# Patient Record
Sex: Male | Born: 1980 | Race: White | Hispanic: No | Marital: Single | Smoking: Former smoker
Health system: Southern US, Community
[De-identification: ages and names within clinical notes are randomized; demographics above are authoritative.]

## PROBLEM LIST (undated history)

## (undated) HISTORY — PX: WISDOM TOOTH EXTRACTION: SHX21

---

## 2002-03-17 HISTORY — PX: HIP SURGERY: SHX245

## 2002-03-29 ENCOUNTER — Encounter: Admission: RE | Admit: 2002-03-29 | Discharge: 2002-03-29 | Payer: Self-pay | Admitting: Orthopedic Surgery

## 2002-03-29 ENCOUNTER — Encounter: Payer: Self-pay | Admitting: Orthopedic Surgery

## 2004-07-23 ENCOUNTER — Ambulatory Visit: Payer: Self-pay | Admitting: Internal Medicine

## 2004-07-26 ENCOUNTER — Ambulatory Visit: Payer: Self-pay | Admitting: Internal Medicine

## 2004-11-01 ENCOUNTER — Ambulatory Visit: Payer: Self-pay | Admitting: Internal Medicine

## 2010-08-27 ENCOUNTER — Emergency Department: Payer: Self-pay | Admitting: Emergency Medicine

## 2010-08-28 ENCOUNTER — Ambulatory Visit: Payer: Self-pay | Admitting: Family Medicine

## 2014-03-14 ENCOUNTER — Emergency Department: Payer: Self-pay | Admitting: Emergency Medicine

## 2014-11-05 ENCOUNTER — Ambulatory Visit
Admission: EM | Admit: 2014-11-05 | Discharge: 2014-11-05 | Disposition: A | Discharge status: Home / Self Care | Payer: BLUE CROSS/BLUE SHIELD | Attending: Family Medicine | Admitting: Family Medicine

## 2014-11-05 DIAGNOSIS — L237 Allergic contact dermatitis due to plants, except food: Secondary | ICD-10-CM | POA: Diagnosis not present

## 2014-11-05 MED ORDER — METHYLPREDNISOLONE 4 MG PO TBPK: ORAL_TABLET | ORAL | Status: DC

## 2014-11-05 MED ORDER — TRIAMCINOLONE ACETONIDE 0.1 % EX CREA: 1.0000 "application " | TOPICAL_CREAM | Freq: Two times a day (BID) | CUTANEOUS | Status: DC

## 2014-11-05 MED ORDER — MUPIROCIN 2 % EX OINT: 1.0000 "application " | TOPICAL_OINTMENT | Freq: Three times a day (TID) | CUTANEOUS | Status: DC

## 2014-11-05 NOTE — Discharge Instructions (Signed)

## 2014-11-05 NOTE — ED Notes (Signed)
Patient states that 2 days ago he got into some poision oak or Ivy. He states that rash originally started on hands and has now spread up arm and to other places on body. Patient describes rash as itchy.

## 2014-11-05 NOTE — ED Provider Notes (Signed)
CSN: [id number]     Arrival date & time 11/05/14  1113 History   First MD Initiated Contact with Patient 11/05/14 1207     Chief Complaint  Patient presents with  . Rash   (Consider location/radiation/quality/duration/timing/severity/associated sxs/prior Treatment) HPI   This 34 year old male resents with rash after he was involved with cutting trees  At work and also in his own yard. Is very similar to previous episodes of poison ivy that he's had in the past. Is very itchy. It involves his arms and a little on his legs mostly. He also mentioned getting some sores on the inside of his nose is bothering him on and off. Does not drain and is usually over the just the opening of the nares and switches from side to side.  No past medical history on file. Past Surgical History  Procedure Laterality Date  . Hip surgery  2004  . Wisdom tooth extraction     No family history on file. Social History  Substance Use Topics  . Smoking status: Never Smoker   . Smokeless tobacco: None  . Alcohol Use: No    Review of Systems  Constitutional: Negative for fever, chills and fatigue.  Skin: Positive for rash.  All other systems reviewed and are negative.   Allergies  Review of patient's allergies indicates no known allergies.  Home Medications   Prior to Admission medications   Medication Sig Start Date End Date Taking? Authorizing Provider  methylPREDNISolone (MEDROL DOSEPAK) 4 MG TBPK tablet Take by mouth PC lunch. 11/05/14   Lutricia Feil, PA-C  mupirocin ointment (BACTROBAN) 2 % Apply 1 application topically 3 (three) times daily. 11/05/14   Lutricia Feil, PA-C  triamcinolone cream (KENALOG) 0.1 % Apply 1 application topically 2 (two) times daily. 11/05/14   Chrissie Noa Burnis Kaser, PA-C   BP 106/63 mmHg  Pulse 52  Temp(Src) 97.5 F (36.4 C) (Tympanic)  Resp 18  Ht 5\' 11"  (1.803 m)  Wt 155 lb (70.308 kg)  BMI 21.63 kg/m2  SpO2 100% Physical Exam  Constitutional: He is oriented to  person, place, and time. He appears well-developed and well-nourished.  HENT:  Head: Normocephalic and atraumatic.  Mouth/Throat: Oropharynx is clear and moist.  Eyes: Pupils are equal, round, and reactive to light.  Neck: Neck supple.  Musculoskeletal: Normal range of motion.  Neurological: He is alert and oriented to person, place, and time.  Skin: Skin is warm and dry. Rash noted.  His upper extremities show patches of linear and circular vesicles on erythematous base with excoriations. He has one small area on his left lateral hip.  Psychiatric: He has a normal mood and affect. His behavior is normal. Judgment and thought content normal.  Nursing note and vitals reviewed.   ED Course  Procedures (including critical care time) Labs Review Labs Reviewed - No data to display  Imaging Review No results found.   MDM   1. Poison ivy dermatitis    New Prescriptions   METHYLPREDNISOLONE (MEDROL DOSEPAK) 4 MG TBPK TABLET    Take by mouth PC lunch.   MUPIROCIN OINTMENT (BACTROBAN) 2 %    Apply 1 application topically 3 (three) times daily.   TRIAMCINOLONE CREAM (KENALOG) 0.1 %    Apply 1 application topically 2 (two) times daily.  Plan: 1. Diagnosis reviewed with patient 2. rx as per orders; risks, benefits, potential side effects reviewed with patient 3. Recommend supportive treatment with cool compresses PRN.  4. F/u prn if symptoms worsen  or don't improve     Lutricia Feil, PA-C 11/05/14 1241

## 2015-01-26 ENCOUNTER — Ambulatory Visit: Payer: Worker's Compensation

## 2015-01-26 ENCOUNTER — Ambulatory Visit
Admission: EM | Admit: 2015-01-26 | Discharge: 2015-01-26 | Disposition: A | Discharge status: Home / Self Care | Payer: Worker's Compensation | Attending: Family Medicine | Admitting: Family Medicine

## 2015-01-26 ENCOUNTER — Encounter: Payer: Self-pay | Admitting: *Deleted

## 2015-01-26 ENCOUNTER — Ambulatory Visit (INDEPENDENT_AMBULATORY_CARE_PROVIDER_SITE_OTHER): Payer: Worker's Compensation

## 2015-01-26 DIAGNOSIS — S93402A Sprain of unspecified ligament of left ankle, initial encounter: Secondary | ICD-10-CM | POA: Diagnosis not present

## 2015-01-26 MED ORDER — ACETAMINOPHEN 500 MG PO TABS: 500.0000 mg | ORAL_TABLET | Freq: Four times a day (QID) | ORAL | Status: DC | PRN

## 2015-01-26 MED ORDER — IBUPROFEN 800 MG PO TABS: 800.0000 mg | ORAL_TABLET | Freq: Three times a day (TID) | ORAL | Status: DC

## 2015-01-26 MED ORDER — TRAMADOL HCL 50 MG PO TABS: 50.0000 mg | ORAL_TABLET | Freq: Four times a day (QID) | ORAL | Status: DC | PRN

## 2015-01-26 NOTE — ED Notes (Signed)
Patient hurt his left ankle today during work at 12:30pm. 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 He turned his left ankle during the accident.

## 2015-01-26 NOTE — ED Provider Notes (Signed)
CSN: [id number]     Arrival date & time 01/26/15  1908 History   First MD Initiated Contact with Patient 01/26/15 1946     Chief Complaint  Patient presents with  . Leg Pain    left side   (Consider location/radiation/quality/duration/timing/severity/associated sxs/prior Treatment) HPI Comments: Married caucasian male works for [employer] here for left foot lower leg pain after slipping off mower and turning left ankle at 1230 today kept working until he had to leave work at 1700 worsening pain with bearing weight ankle and distal to knee pain even with nonweight bearing.  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  Patient is a 34 y.o. male presenting with leg pain. The history is provided by the patient and the spouse.  Leg Pain Location:  Leg and foot Time since incident:  8 hours Injury: yes   Mechanism of injury: fall   Fall:    Height of fall:  2 feet   Impact surface:  Grass   Point of impact:  Feet   Entrapped after fall: no   Leg location:  L leg Foot location:  L foot Pain details:    Quality:  Aching and throbbing   Radiates to:  Does not radiate   Severity:  Severe   Onset quality:  Sudden   Duration:  8 hours   Timing:  Constant   Progression:  Worsening Chronicity:  New Dislocation: no   Foreign body present:  No foreign bodies Prior injury to area:  No Relieved by:  Nothing Worsened by:  Bearing weight, flexion, rotation, adduction, abduction, extension and activity Ineffective treatments:  Rest, NSAIDs and elevation (vicodin) Associated symptoms: decreased ROM and swelling   Associated symptoms: no back pain, no fatigue, no fever, no itching, no muscle weakness, no neck pain, no numbness, no stiffness and no tingling   Risk factors: known bone disorder   Risk factors: no concern for non-accidental trauma, no frequent fractures, no obesity and no recent  illness     History reviewed. No pertinent past medical history. Past Surgical History  Procedure Laterality Date  . Hip surgery  2004  . Wisdom tooth extraction     History reviewed. No pertinent family history. Social History  Substance Use Topics  . Smoking status: Former Games developer  . Smokeless tobacco: Former Neurosurgeon  . Alcohol Use: No    Review of Systems  Constitutional: Negative for fever, chills, diaphoresis, activity change, appetite change, fatigue and unexpected weight change.  HENT: Negative for congestion, dental problem, drooling, ear discharge, ear pain, facial swelling, hearing loss, mouth sores, nosebleeds, postnasal drip, rhinorrhea, sinus pressure, sneezing, sore throat, tinnitus, trouble swallowing and voice change.   Eyes: Negative for photophobia, pain, discharge, redness, itching and visual disturbance.  Respiratory: Negative for cough, choking, chest tightness, shortness of breath, wheezing and stridor.   Cardiovascular: Negative for chest pain, palpitations and leg swelling.  Gastrointestinal: Negative for nausea, vomiting, abdominal pain, diarrhea, constipation, blood in stool and abdominal distention.  Endocrine: Negative for cold intolerance and heat intolerance.  Genitourinary: Negative for dysuria.  Musculoskeletal: Positive for myalgias, joint swelling and gait problem. Negative for back pain, arthralgias, stiffness, neck pain and neck stiffness.  Skin: Negative for color change, itching, pallor, rash and wound.  Allergic/Immunologic: Negative for environmental allergies and food allergies.  Neurological: Negative for dizziness, tremors, seizures, syncope, facial asymmetry, speech difficulty, weakness, light-headedness,  numbness and headaches.  Hematological: Negative for adenopathy. Does not bruise/bleed easily.  Psychiatric/Behavioral: Negative for behavioral problems, confusion, sleep disturbance and agitation.    Allergies  Review of patient's allergies  indicates no known allergies.  Home Medications   Prior to Admission medications   Medication Sig Start Date End Date Taking? Authorizing Provider  mupirocin ointment (BACTROBAN) 2 % Apply 1 application topically 3 (three) times daily. 11/05/14  Yes Lutricia Feil, PA-C  triamcinolone cream (KENALOG) 0.1 % Apply 1 application topically 2 (two) times daily. 11/05/14  Yes Lutricia Feil, PA-C  acetaminophen (TYLENOL) 500 MG tablet Take 1 tablet (500 mg total) by mouth every 6 (six) hours as needed for mild pain or moderate pain. 01/26/15   Barbaraann Barthel, NP  ibuprofen (ADVIL,MOTRIN) 800 MG tablet Take 1 tablet (800 mg total) by mouth 3 (three) times daily. 01/26/15   Barbaraann Barthel, NP  methylPREDNISolone (MEDROL DOSEPAK) 4 MG TBPK tablet Take by mouth PC lunch. 11/05/14   Lutricia Feil, PA-C  traMADol (ULTRAM) 50 MG tablet Take 1 tablet (50 mg total) by mouth every 6 (six) hours as needed for moderate pain or severe pain. 01/26/15   Barbaraann Barthel, NP   Meds Ordered and Administered this Visit  Medications - No data to display  BP 122/70 mmHg  Pulse 58  Temp(Src) 98.1 F (36.7 C) (Oral)  Resp 18  Ht 5\' 11"  (1.803 m)  Wt 155 lb (70.308 kg)  BMI 21.63 kg/m2  SpO2 98% No data found.   Physical Exam  Constitutional: He is oriented to person, place, and time. Vital signs are normal. He appears well-developed and well-nourished. He is cooperative.  Non-toxic appearance. He does not have a sickly appearance. He does not appear ill. No distress.  HENT:  Head: Normocephalic and atraumatic.  Right Ear: Hearing and external ear normal.  Left Ear: Hearing and external ear normal.  Nose: Nose normal.  Mouth/Throat: Oropharynx is clear and moist. No oropharyngeal exudate.  Eyes: Conjunctivae, EOM and lids are normal. Pupils are equal, round, and reactive to light. Right eye exhibits no chemosis, no discharge, no exudate and no hordeolum. No foreign body present in the right eye.  Left eye exhibits no chemosis, no discharge, no exudate and no hordeolum. No foreign body present in the left eye. Right conjunctiva is not injected. Right conjunctiva has no hemorrhage. Left conjunctiva is not injected. Left conjunctiva has no hemorrhage. No scleral icterus. Right eye exhibits normal extraocular motion and no nystagmus. Left eye exhibits normal extraocular motion and no nystagmus. Right pupil is round and reactive. Left pupil is round and reactive. Pupils are equal.  Neck: Trachea normal and normal range of motion. Neck supple. No spinous process tenderness and no muscular tenderness present. No rigidity. No tracheal deviation, no edema, no erythema and normal range of motion present. No thyroid mass and no thyromegaly present.  Cardiovascular: Normal rate, regular rhythm, S1 normal, S2 normal, normal heart sounds, intact distal pulses and normal pulses.  PMI is not displaced.  Exam reveals no gallop and no friction rub.   No murmur heard. Pulses:      Dorsalis pedis pulses are 2+ on the right side, and 2+ on the left side.       Posterior tibial pulses are 2+ on the right side, and 2+ on the left side.  Pulmonary/Chest: Effort normal and breath sounds normal. No accessory muscle usage or stridor. No respiratory distress. He has no decreased  breath sounds. He has no wheezes. He has no rhonchi. He has no rales. He exhibits no tenderness.  Abdominal: Soft. He exhibits no distension.  Musculoskeletal: He exhibits edema and tenderness.       Right shoulder: Normal.       Left shoulder: Normal.       Right elbow: Normal.      Left elbow: Normal.       Right wrist: Normal.       Left wrist: Normal.       Right hip: Normal.       Left hip: Normal.       Right knee: Normal.       Left knee: Normal.       Right ankle: Normal.       Left ankle: He exhibits decreased range of motion and swelling. He exhibits no ecchymosis, no deformity, no laceration and normal pulse. Tenderness. Lateral  malleolus, medial malleolus, head of 5th metatarsal and proximal fibula tenderness found. Achilles tendon normal. Achilles tendon exhibits no pain and no defect.       Cervical back: Normal.       Right forearm: Normal.       Left forearm: Normal.       Right hand: Normal.       Left hand: Normal.       Right lower leg: Normal.       Legs:      Right foot: Normal.       Left foot: There is decreased range of motion, tenderness and bony tenderness. There is no swelling, normal capillary refill, no crepitus, no deformity and no laceration.       Feet:  0-1+/4 nonpitting edema bilateral malleolus; TTP all MTPs, malleolus and distal tib/fibula; pain with eversion/inversion, flexion, extension toes lying on gurney not weight bearing left floot  Lymphadenopathy:    He has no cervical adenopathy.  Neurological: He is alert and oriented to person, place, and time. He is not disoriented. He displays no atrophy and no tremor. No cranial nerve deficit or sensory deficit. He exhibits normal muscle tone. He displays no seizure activity. Coordination normal. GCS eye subscore is 4. GCS verbal subscore is 5. GCS motor subscore is 6.  Gait not observed due to pain 10/10 with non weight bearing also  Skin: Skin is warm, dry and intact. No abrasion, no bruising, no burn, no ecchymosis, no laceration, no lesion, no petechiae and no rash noted. He is not diaphoretic. No cyanosis or erythema. No pallor. Nails show no clubbing.  Psychiatric: He has a normal mood and affect. His speech is normal and behavior is normal. Judgment and thought content normal. Cognition and memory are normal.  Nursing note and vitals reviewed.   ED Course  Procedures (including critical care time)  Labs Review Labs Reviewed - No data to display  Imaging Review Dg Tibia/fibula Left  01/26/2015  CLINICAL DATA:  Larey Seat at work with distal anterior lower leg pain. EXAM: LEFT TIBIA AND FIBULA - 2 VIEW COMPARISON:  None. FINDINGS: There  is no evidence of fracture or other focal bone lesions. Soft tissues are unremarkable. IMPRESSION: Negative. Electronically Signed   By: Paulina Fusi M.D.   On: 01/26/2015 20:06    Reviewed [place] Controlled Substances Website 03/14/2014 03/14/2014 TRAMADOL HCL 50 MG TABLET [id number] 30 5 0 0 [address] [place] , [place] [id number] Berline Chough,  [place], [place] 1980/04/25 [address] [place], [place] [address] 04  30  2025 Discussed with patient ankle sprain no fracture, bone lesions or dislocation noted.  Copy of radiology report given to patient.  Crutches and ASO sized and distributed from clinic stock by RN Jacklynn Lewis and given to patient.  Discussed elevation, ice, compression, crutches no further weight bearing tonight.  Take motrin 800mg  po upon arrival home.  No driving tonight due to vicodin administration by patient at home prior to arrival.  May take tramadol in am.  Tylenol 500mg  po tonight prn due to 600mg  prior to arrival if needed for breakthrough pain middle of night tonight.  MDM   1. Ankle sprain, left, initial encounter    Patient was instructed to rest, ice and elevate the ankle as much as possible.  Activity as tolerated and work on ROM exercises.  Patient is to take NSAIDS motrin 800mg  po TID and tylenol 1000mg  po QID  as needed. Tramadol 50mg  po q6h prn breakthrough pain not improved with nsaids, ice, elevation, ASO use. Discussed at risk to reinjure ankle over the next year and to wear supportive footwear/ankle sleeve/ace bandage.  Follow up re-evaluation in 7 days with [facility] provider.  Work[employer] worker paperwork completed and given to patient.  Avoid operating dangerous equipment while taking tramadol may cause sedation. exitcare handout on contusion, ankle sprain with rehab exercises, cryotherapy given to patient.  Work restriction note given to patient.  Patient  And spouse verbalized agreement and understanding of treatment plan and had no  further questions at this time.   P2:  Injury Prevention and Fitness.    Barbaraann Barthel, NP 01/26/15 2040

## 2015-01-26 NOTE — Discharge Instructions (Signed)
Acute Ankle Sprain With Phase I Rehab An acute ankle sprain is a partial or complete tear in one or more of the ligaments of the ankle due to traumatic injury. The severity of the injury depends on both the number of ligaments sprained and the grade of sprain. There are 3 grades of sprains.   A grade 1 sprain is a mild sprain. There is a slight pull without obvious tearing. There is no loss of strength, and the muscle and ligament are the correct length.  A grade 2 sprain is a moderate sprain. There is tearing of fibers within the substance of the ligament where it connects two bones or two cartilages. The length of the ligament is increased, and there is usually decreased strength.  A grade 3 sprain is a complete rupture of the ligament and is uncommon. In addition to the grade of sprain, there are three types of ankle sprains.  Lateral ankle sprains: This is a sprain of one or more of the three ligaments on the outer side (lateral) of the ankle. These are the most common sprains. Medial ankle sprains: There is one large triangular ligament of the inner side (medial) of the ankle that is susceptible to injury. Medial ankle sprains are less common. Syndesmosis, "high ankle," sprains: The syndesmosis is the ligament that connects the two bones of the lower leg. Syndesmosis sprains usually only occur with very severe ankle sprains. SYMPTOMS  Pain, tenderness, and swelling in the ankle, starting at the side of injury that may progress to the whole ankle and foot with time.  "Pop" or tearing sensation at the time of injury.  Bruising that may spread to the heel.  Impaired ability to walk soon after injury. CAUSES   Acute ankle sprains are caused by trauma placed on the ankle that temporarily forces or pries the anklebone (talus) out of its normal socket.  Stretching or tearing of the ligaments that normally hold the joint in place (usually due to a twisting injury). RISK INCREASES  WITH:  Previous ankle sprain.  Sports in which the foot may land awkwardly (i.e., basketball, volleyball, or soccer) or walking or running on uneven or rough surfaces.  Shoes with inadequate support to prevent sideways motion when stress occurs.  Poor strength and flexibility.  Poor balance skills.  Contact sports. PREVENTION   Warm up and stretch properly before activity.  Maintain physical fitness:  Ankle and leg flexibility, muscle strength, and endurance.  Cardiovascular fitness.  Balance training activities.  Use proper technique and have a coach correct improper technique.  Taping, protective strapping, bracing, or high-top tennis shoes may help prevent injury. Initially, tape is best; however, it loses most of its support function within 10 to 15 minutes.  Wear proper-fitted protective shoes (High-top shoes with taping or bracing is more effective than either alone).  Provide the ankle with support during sports and practice activities for 12 months following injury. PROGNOSIS   If treated properly, ankle sprains can be expected to recover completely; however, the length of recovery depends on the degree of injury.  A grade 1 sprain usually heals enough in 5 to 7 days to allow modified activity and requires an average of 6 weeks to heal completely.  A grade 2 sprain requires 6 to 10 weeks to heal completely.  A grade 3 sprain requires 12 to 16 weeks to heal.  A syndesmosis sprain often takes more than 3 months to heal. RELATED COMPLICATIONS   Frequent recurrence of symptoms may  result in a chronic problem. Appropriately addressing the problem the first time decreases the frequency of recurrence and optimizes healing time. Severity of the initial sprain does not predict the likelihood of later instability. °· Injury to other structures (bone, cartilage, or tendon). °· A chronically unstable or arthritic ankle joint is a possibility with repeated  sprains. °TREATMENT °Treatment initially involves the use of ice, medication, and compression bandages to help reduce pain and inflammation. Ankle sprains are usually immobilized in a walking cast or boot to allow for healing. Crutches may be recommended to reduce pressure on the injury. After immobilization, strengthening and stretching exercises may be necessary to regain strength and a full range of motion. Surgery is rarely needed to treat ankle sprains. °MEDICATION  °· Nonsteroidal anti-inflammatory medications, such as aspirin and ibuprofen (do not take for the first 3 days after injury or within 7 days before surgery), or other minor pain relievers, such as acetaminophen, are often recommended. Take these as directed by your caregiver. Contact your caregiver immediately if any bleeding, stomach upset, or signs of an allergic reaction occur from these medications. °· Ointments applied to the skin may be helpful. °· Pain relievers may be prescribed as necessary by your caregiver. Do not take prescription pain medication for longer than 4 to 7 days. Use only as directed and only as much as you need. °HEAT AND COLD °· Cold treatment (icing) is used to relieve pain and reduce inflammation for acute and chronic cases. Cold should be applied for 10 to 15 minutes every 2 to 3 hours for inflammation and pain and immediately after any activity that aggravates your symptoms. Use ice packs or an ice massage. °· Heat treatment may be used before performing stretching and strengthening activities prescribed by your caregiver. Use a heat pack or a warm soak. °SEEK IMMEDIATE MEDICAL CARE IF:  °· Pain, swelling, or bruising worsens despite treatment. °· You experience pain, numbness, discoloration, or coldness in the foot or toes. °· New, unexplained symptoms develop (drugs used in treatment may produce side effects.) °EXERCISES  °PHASE I EXERCISES °RANGE OF MOTION (ROM) AND STRETCHING EXERCISES - Ankle Sprain, Acute Phase I,  Weeks 1 to 2 °These exercises may help you when beginning to restore flexibility in your ankle. You will likely work on these exercises for the 1 to 2 weeks after your injury. Once your physician, physical therapist, or athletic trainer sees adequate progress, he or she will advance your exercises. While completing these exercises, remember:  °· Restoring tissue flexibility helps normal motion to return to the joints. This allows healthier, less painful movement and activity. °· An effective stretch should be held for at least 30 seconds. °· A stretch should never be painful. You should only feel a gentle lengthening or release in the stretched tissue. °RANGE OF MOTION - Dorsi/Plantar Flexion °· While sitting with your right / left knee straight, draw the top of your foot upwards by flexing your ankle. Then reverse the motion, pointing your toes downward. °· Hold each position for __________ seconds. °· After completing your first set of exercises, repeat this exercise with your knee bent. °Repeat __________ times. Complete this exercise __________ times per day.  °RANGE OF MOTION - Ankle Alphabet °· Imagine your right / left big toe is a pen. °· Keeping your hip and knee still, write out the entire alphabet with your "pen." Make the letters as large as you can without increasing any discomfort. °Repeat __________ times. Complete this exercise __________   times per day.  °STRENGTHENING EXERCISES - Ankle Sprain, Acute -Phase I, Weeks 1 to 2 °These exercises may help you when beginning to restore strength in your ankle. You will likely work on these exercises for 1 to 2 weeks after your injury. Once your physician, physical therapist, or athletic trainer sees adequate progress, he or she will advance your exercises. While completing these exercises, remember:  °· Muscles can gain both the endurance and the strength needed for everyday activities through controlled exercises. °· Complete these exercises as instructed by  your physician, physical therapist, or athletic trainer. Progress the resistance and repetitions only as guided. °· You may experience muscle soreness or fatigue, but the pain or discomfort you are trying to eliminate should never worsen during these exercises. If this pain does worsen, stop and make certain you are following the directions exactly. If the pain is still present after adjustments, discontinue the exercise until you can discuss the trouble with your clinician. °STRENGTH - Dorsiflexors °· Secure a rubber exercise band/tubing to a fixed object (i.e., table, pole) and loop the other end around your right / left foot. °· Sit on the floor facing the fixed object. The band/tubing should be slightly tense when your foot is relaxed. °· Slowly draw your foot back toward you using your ankle and toes. °· Hold this position for __________ seconds. Slowly release the tension in the band and return your foot to the starting position. °Repeat __________ times. Complete this exercise __________ times per day.  °STRENGTH - Plantar-flexors  °· Sit with your right / left leg extended. Holding onto both ends of a rubber exercise band/tubing, loop it around the ball of your foot. Keep a slight tension in the band. °· Slowly push your toes away from you, pointing them downward. °· Hold this position for __________ seconds. Return slowly, controlling the tension in the band/tubing. °Repeat __________ times. Complete this exercise __________ times per day.  °STRENGTH - Ankle Eversion °· Secure one end of a rubber exercise band/tubing to a fixed object (table, pole). Loop the other end around your foot just before your toes. °· Place your fists between your knees. This will focus your strengthening at your ankle. °· Drawing the band/tubing across your opposite foot, slowly, pull your little toe out and up. Make sure the band/tubing is positioned to resist the entire motion. °· Hold this position for __________ seconds. °Have  your muscles resist the band/tubing as it slowly pulls your foot back to the starting position.  °Repeat __________ times. Complete this exercise __________ times per day.  °STRENGTH - Ankle Inversion °· Secure one end of a rubber exercise band/tubing to a fixed object (table, pole). Loop the other end around your foot just before your toes. °· Place your fists between your knees. This will focus your strengthening at your ankle. °· Slowly, pull your big toe up and in, making sure the band/tubing is positioned to resist the entire motion. °· Hold this position for __________ seconds. °· Have your muscles resist the band/tubing as it slowly pulls your foot back to the starting position. °Repeat __________ times. Complete this exercises __________ times per day.  °STRENGTH - Towel Curls °· Sit in a chair positioned on a non-carpeted surface. °· Place your right / left foot on a towel, keeping your heel on the floor. °· Pull the towel toward your heel by only curling your toes. Keep your heel on the floor. °· If instructed by your physician, physical therapist,   or athletic trainer, add weight to the end of the towel. Repeat __________ times. Complete this exercise __________ times per day.   This information is not intended to replace advice given to you by your health care provider. Make sure you discuss any questions you have with your health care provider.   Document Released: 10/02/2004 Document Revised: 03/24/2014 Document Reviewed: 06/15/2008 Elsevier Interactive Patient Education 2016 [place].  Cryotherapy Cryotherapy means treatment with cold. Ice or gel packs can be used to reduce both pain and swelling. Ice is the most helpful within the first 24 to 48 hours after an injury or flare-up from overusing a muscle or joint. Sprains, strains, spasms, burning pain, shooting pain, and aches can all be eased with ice. Ice can also be used when recovering from surgery. Ice is effective, has very few side  effects, and is safe for most people to use. PRECAUTIONS  Ice is not a safe treatment option for people with:  Raynaud phenomenon. This is a condition affecting small blood vessels in the extremities. Exposure to cold may cause your problems to return.  Cold hypersensitivity. There are many forms of cold hypersensitivity, including:  Cold urticaria. Red, itchy hives appear on the skin when the tissues begin to warm after being iced.  Cold erythema. This is a red, itchy rash caused by exposure to cold.  Cold hemoglobinuria. Red blood cells break down when the tissues begin to warm after being iced. The hemoglobin that carry oxygen are passed into the urine because they cannot combine with blood proteins fast enough.  Numbness or altered sensitivity in the area being iced. If you have any of the following conditions, do not use ice until you have discussed cryotherapy with your caregiver:  Heart conditions, such as arrhythmia, angina, or chronic heart disease.  High blood pressure.  Healing wounds or open skin in the area being iced.  Current infections.  Rheumatoid arthritis.  Poor circulation.  Diabetes. Ice slows the blood flow in the region it is applied. This is beneficial when trying to stop inflamed tissues from spreading irritating chemicals to surrounding tissues. However, if you expose your skin to cold temperatures for too long or without the proper protection, you can damage your skin or nerves. Watch for signs of skin damage due to cold. HOME CARE INSTRUCTIONS Follow these tips to use ice and cold packs safely.  Place a dry or damp towel between the ice and skin. A damp towel will cool the skin more quickly, so you may need to shorten the time that the ice is used.  For a more rapid response, add gentle compression to the ice.  Ice for no more than 10 to 20 minutes at a time. The bonier the area you are icing, the less time it will take to get the benefits of  ice.  Check your skin after 5 minutes to make sure there are no signs of a poor response to cold or skin damage.  Rest 20 minutes or more between uses.  Once your skin is numb, you can end your treatment. You can test numbness by very lightly touching your skin. The touch should be so light that you do not see the skin dimple from the pressure of your fingertip. When using ice, most people will feel these normal sensations in this order: cold, burning, aching, and numbness.  Do not use ice on someone who cannot communicate their responses to pain, such as small children or people with dementia.  HOW TO MAKE AN ICE PACK Ice packs are the most common way to use ice therapy. Other methods include ice massage, ice baths, and cryosprays. Muscle creams that cause a cold, tingly feeling do not offer the same benefits that ice offers and should not be used as a substitute unless recommended by your caregiver. To make an ice pack, do one of the following:  Place crushed ice or a bag of frozen vegetables in a sealable plastic bag. Squeeze out the excess air. Place this bag inside another plastic bag. Slide the bag into a pillowcase or place a damp towel between your skin and the bag.  Mix 3 parts water with 1 part rubbing alcohol. Freeze the mixture in a sealable plastic bag. When you remove the mixture from the freezer, it will be slushy. Squeeze out the excess air. Place this bag inside another plastic bag. Slide the bag into a pillowcase or place a damp towel between your skin and the bag. SEEK MEDICAL CARE IF:  You develop white spots on your skin. This may give the skin a blotchy (mottled) appearance.  Your skin turns blue or pale.  Your skin becomes waxy or hard.  Your swelling gets worse. MAKE SURE YOU:   Understand these instructions.  Will watch your condition.  Will get help right away if you are not doing well or get worse.   This information is not intended to replace advice given  to you by your health care provider. Make sure you discuss any questions you have with your health care provider.   Document Released: 10/28/2010 Document Revised: 03/24/2014 Document Reviewed: 10/28/2010 Elsevier Interactive Patient Education [employer]

## 2015-01-27 ENCOUNTER — Ambulatory Visit: Payer: Worker's Compensation | Admitting: Radiology

## 2015-01-27 DIAGNOSIS — Z0283 Encounter for blood-alcohol and blood-drug test: Secondary | ICD-10-CM
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
## 2015-01-30 ENCOUNTER — Encounter: Payer: Self-pay | Admitting: Emergency Medicine

## 2015-01-30 ENCOUNTER — Ambulatory Visit
Admission: EM | Admit: 2015-01-30 | Discharge: 2015-01-30 | Disposition: A | Discharge status: Home / Self Care | Payer: Worker's Compensation | Attending: Family Medicine | Admitting: Family Medicine

## 2015-01-30 DIAGNOSIS — S93402D Sprain of unspecified ligament of left ankle, subsequent encounter: Secondary | ICD-10-CM

## 2015-01-30 NOTE — ED Provider Notes (Signed)
CSN: [id number]     Arrival date & time 01/30/15  1019 History   First MD Initiated Contact with Patient 01/30/15 1133     Chief Complaint  Patient presents with  . Worker's Comp Follow-up Visit   . Ankle Injury   (Consider location/radiation/quality/duration/timing/severity/associated sxs/prior Treatment) HPI   34 year old male who had injured himself on 01/26/2015 twisted his left ankle into inversion staying a sprain of the anterior fibulotalar ligament. He was given crutches and a ankle sprays which she has been using up. He found that after the first night at the ankle pain had in improved dramatically to the point now where he is only wearing the brace and although he had restrictions of 20 no pushing pulling lifting and 25 pounds he is found this to be in too restrictive he feels that he is certainly able to return to full duty.  History reviewed. No pertinent past medical history. Past Surgical History  Procedure Laterality Date  . Hip surgery  2004  . Wisdom tooth extraction     History reviewed. No pertinent family history. Social History  Substance Use Topics  . Smoking status: Former Games developermoker  . Smokeless tobacco: Former NeurosurgeonUser  . Alcohol Use: No    Review of Systems  Constitutional: Negative for fever, chills, diaphoresis, activity change and fatigue.  Musculoskeletal: Positive for myalgias.  All other systems reviewed and are negative.   Allergies  Review of patient's allergies indicates no known allergies.  Home Medications   Prior to Admission medications   Medication Sig Start Date End Date Taking? Authorizing Provider  acetaminophen (TYLENOL) 500 MG tablet Take 1 tablet (500 mg total) by mouth every 6 (six) hours as needed for mild pain or moderate pain. 01/26/15   Barbaraann Barthelina A Betancourt, NP  ibuprofen (ADVIL,MOTRIN) 800 MG tablet Take 1 tablet (800 mg total) by mouth 3 (three) times daily. 01/26/15   Barbaraann Barthelina A Betancourt, NP  mupirocin ointment (BACTROBAN) 2 % Apply  1 application topically 3 (three) times daily. 11/05/14   Lutricia FeilWilliam P Brunella Wileman, PA-C  traMADol (ULTRAM) 50 MG tablet Take 1 tablet (50 mg total) by mouth every 6 (six) hours as needed for moderate pain or severe pain. 01/26/15   Barbaraann Barthelina A Betancourt, NP  triamcinolone cream (KENALOG) 0.1 % Apply 1 application topically 2 (two) times daily. 11/05/14   Lutricia FeilWilliam P Lavaughn Haberle, PA-C   Meds Ordered and Administered this Visit  Medications - No data to display  BP 122/73 mmHg  Pulse 55  Temp(Src) 98 F (36.7 C) (Tympanic)  Resp 16  Ht 5\' 11"  (1.803 m)  Wt 155 lb (70.308 kg)  BMI 21.63 kg/m2 No data found.   Physical Exam  Constitutional: He is oriented to person, place, and time. He appears well-developed and well-nourished. No distress.  HENT:  Head: Normocephalic and atraumatic.  Eyes: Pupils are equal, round, and reactive to light.  Musculoskeletal: Normal range of motion. He exhibits no edema or tenderness.  Examination of the left ankle and comparison to the right shows no swelling ecchymosis erythema. Motion is full anti-flexion and dorsiflexion. Very little discomfort is noticed over the anterior fibulotalar ligament. He has a normal ambulatory gait. No antalgic component to it. Subtalar motion is full.  Neurological: He is alert and oriented to person, place, and time.  Skin: Skin is warm and dry. No rash noted. He is not diaphoretic. No erythema. No pallor.  Psychiatric: He has a normal mood and affect. His behavior is normal. Judgment and thought  content normal.  Nursing note and vitals reviewed.   ED Course  Procedures (including critical care time)  Labs Review Labs Reviewed - No data to display  Imaging Review No results found.   Visual Acuity Review  Right Eye Distance:   Left Eye Distance:   Bilateral Distance:    Right Eye Near:   Left Eye Near:    Bilateral Near:         MDM   1. Left ankle sprain, subsequent encounter    Plan: 1.Diagnosis reviewed with  patient 2. rx as per orders; risks, benefits, potential side effects reviewed with patient 3. Recommend supportive treatment with ankle brace PRN.  4. F/u prn if symptoms worsen or don't improve     Lutricia Feil, PA-C 01/30/15 1202

## 2015-01-30 NOTE — ED Notes (Signed)
Patient here for worker's comp follow-up visit.  Patient reports wearing his brace at work and reports no pain.
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
## 2016-11-13 IMAGING — CR DG TIBIA/FIBULA 2V*L*
2 series · 2 of 2 positions shown · non-contrast
Comparison: None.

CLINICAL DATA: Fell at work with distal anterior lower leg pain.

EXAM:
LEFT TIBIA AND FIBULA - 2 VIEW

[tibia ap]
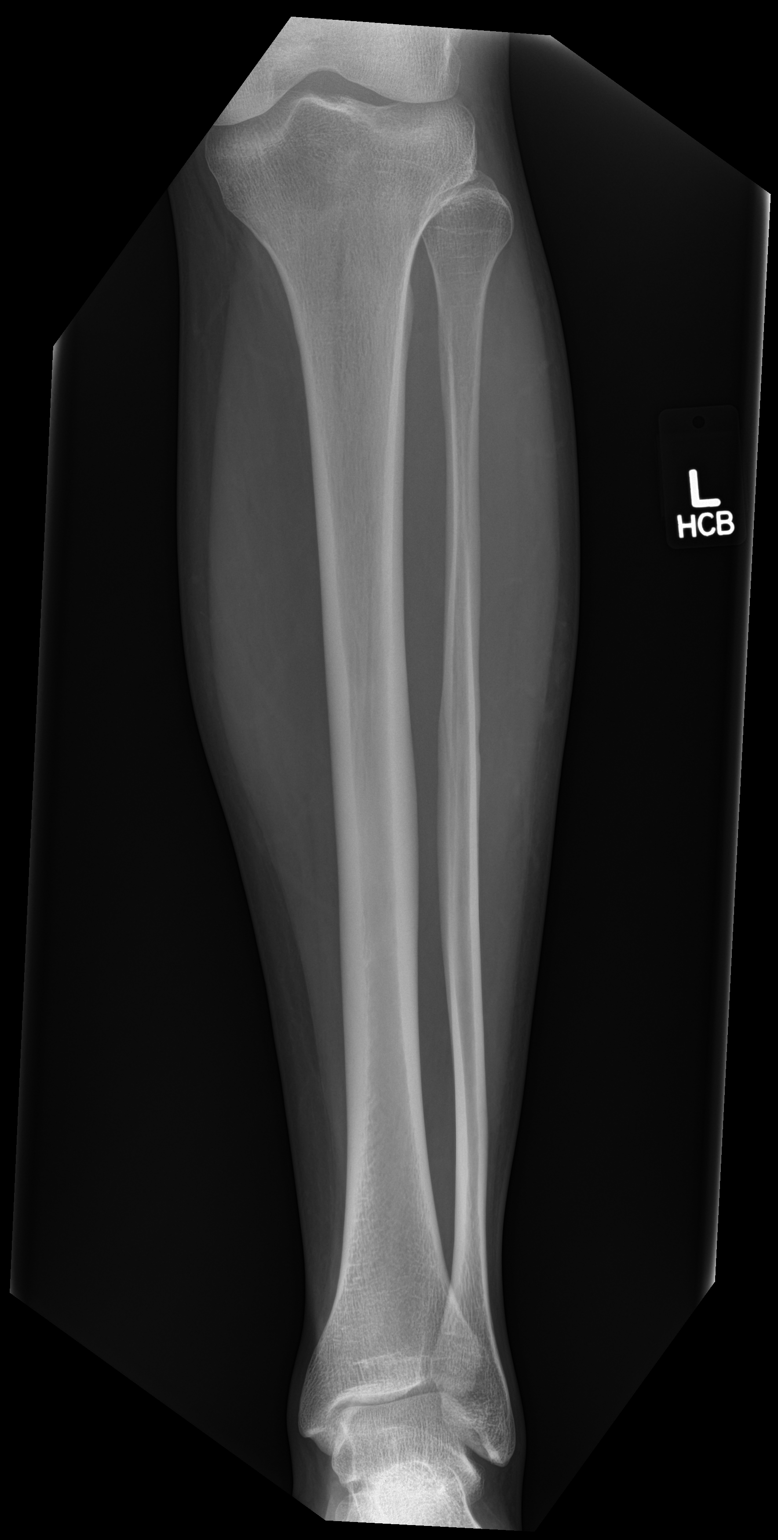

[tibia lat]
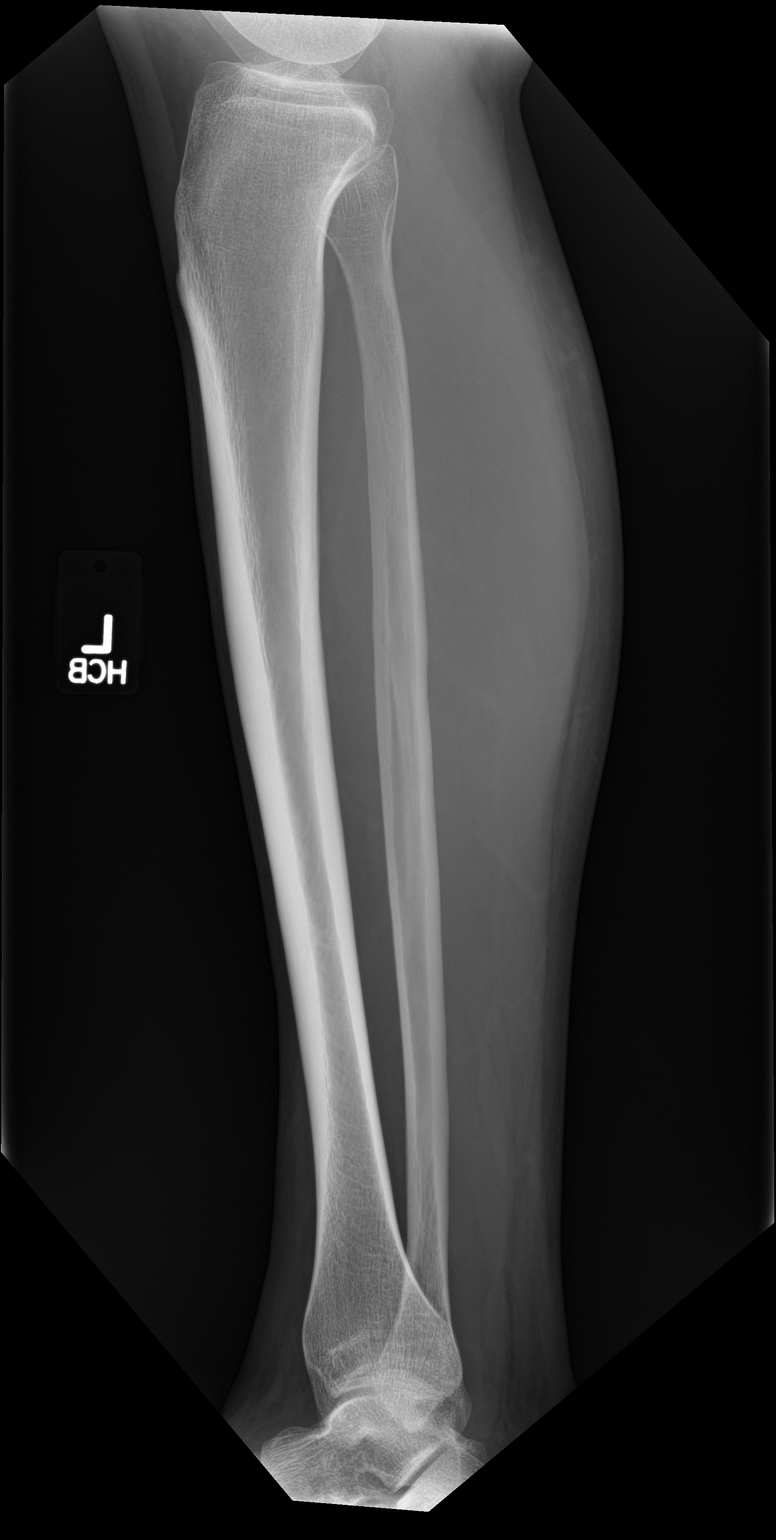

[2 of 2 positions shown; findings below may reference images not displayed]

FINDINGS: There is no evidence of fracture or other focal bone lesions. Soft
tissues are unremarkable.
IMPRESSION: Negative.

## 2019-04-07 ENCOUNTER — Ambulatory Visit: Payer: Self-pay | Attending: Internal Medicine

## 2019-04-07 DIAGNOSIS — Z20822 Contact with and (suspected) exposure to covid-19: Secondary | ICD-10-CM | POA: Insufficient documentation

## 2019-04-08 LAB — NOVEL CORONAVIRUS, NAA: SARS-CoV-2, NAA: NOT DETECTED

## 2019-04-14 ENCOUNTER — Ambulatory Visit: Payer: Self-pay | Attending: Internal Medicine

## 2019-04-14 DIAGNOSIS — Z20822 Contact with and (suspected) exposure to covid-19: Secondary | ICD-10-CM | POA: Insufficient documentation

## 2019-04-15 LAB — NOVEL CORONAVIRUS, NAA: SARS-CoV-2, NAA: NOT DETECTED

## 2019-07-29 ENCOUNTER — Ambulatory Visit: Payer: Self-pay | Attending: Internal Medicine

## 2019-07-29 DIAGNOSIS — Z23 Encounter for immunization: Secondary | ICD-10-CM

## 2019-07-29 NOTE — Progress Notes (Signed)
   Covid-19 Vaccination Clinic  Name:  Bryan Moss    MRN: [id number] DOB: 01-13-1981  07/29/2019  Bryan Moss was observed post Covid-19 immunization for 15 minutes without incident. He was provided with Vaccine Information Sheet and instruction to access the V-Safe system.   Bryan Moss was instructed to call 911 with any severe reactions post vaccine: Marland Kitchen Difficulty breathing  . Swelling of face and throat  . A fast heartbeat  . A bad rash all over body  . Dizziness and weakness   Immunizations Administered    Name Date Dose VIS Date Route   Pfizer COVID-19 Vaccine 07/29/2019  9:14 AM 0.3 mL 05/11/2018 Intramuscular   Manufacturer: [employer], [employer]   Lot: [id number]   NDC: [address]-8

## 2019-08-23 ENCOUNTER — Ambulatory Visit: Payer: Self-pay | Attending: Internal Medicine

## 2019-08-23 DIAGNOSIS — Z23 Encounter for immunization: Secondary | ICD-10-CM

## 2019-08-23 NOTE — Progress Notes (Signed)
   Covid-[facility] Vaccination [facility]  Name:  Bobby Barton    MRN: [id number] DOB: 1980/12/03  08/23/2019  Mr. Brownley was observed post Covid-19 immunization for 15 minutes without incident. He was provided with Vaccine Information Sheet and instruction to access the V-Safe system.   Mr. Bostock was instructed to call 911 with any severe reactions post vaccine: Marland Kitchen Difficulty breathing  . Swelling of face and throat  . A fast heartbeat  . A bad rash all over body  . Dizziness and weakness   Immunizations Administered    Name Date Dose VIS Date Route   Pfizer COVID-19 Vaccine 08/23/2019  8:59 AM 0.3 mL 05/11/2018 Intramuscular   Manufacturer: [employer], [employer]   Lot: [id number]   NDC: [address]-8

## 2020-03-19 ENCOUNTER — Ambulatory Visit: Payer: Self-pay

## 2023-06-15 ENCOUNTER — Ambulatory Visit: Payer: BC Managed Care – PPO | Admitting: General Practice

## 2023-06-15 VITALS — BP 130/82 | HR 88 | Temp 97.9°F | Ht 71.0 in | Wt 186.0 lb

## 2023-06-15 DIAGNOSIS — Z114 Encounter for screening for human immunodeficiency virus [HIV]: Secondary | ICD-10-CM | POA: Diagnosis not present

## 2023-06-15 DIAGNOSIS — R232 Flushing: Secondary | ICD-10-CM | POA: Insufficient documentation

## 2023-06-15 DIAGNOSIS — Z1159 Encounter for screening for other viral diseases: Secondary | ICD-10-CM

## 2023-06-15 DIAGNOSIS — Z0000 Encounter for general adult medical examination without abnormal findings: Secondary | ICD-10-CM | POA: Diagnosis not present

## 2023-06-15 DIAGNOSIS — Z7689 Persons encountering health services in other specified circumstances: Secondary | ICD-10-CM | POA: Insufficient documentation

## 2023-06-15 NOTE — Assessment & Plan Note (Addendum)
 Unclear etiology.  Suspect this is related to possible allergic reaction to alcohol.  No red flag symptoms today.  Discussed with patient at length, he will keep a log of his symptoms and bring back if these events occur more frequently.  Consider referral for allergy testing.

## 2023-06-15 NOTE — Assessment & Plan Note (Signed)
 EMR reviewed briefly.

## 2023-06-15 NOTE — Patient Instructions (Addendum)
 Stop by the lab prior to leaving today. I will notify you of your results once received.   Keep a log of your symptoms related to the flushing.   It was a pleasure meeting you!

## 2023-06-15 NOTE — Progress Notes (Signed)
 New Patient Office Visit  Subjective    Patient ID: Bryan Moss, male    DOB: Jan 18, 1981  Age: 43 y.o. MRN: [id number]  CC:  Chief Complaint  Patient presents with   New Patient (Initial Visit)   flushed    Gets flushed from time to time when he has whiskey; usually gets SOB also with this. But the last time it happened he was at [facility] having steak and a glass a beer and get flushed; no SOB.     HPI Bryan Moss is a 43 y.o. male presents to establish care, for complete physical and follow up of chronic conditions.  Last PCP/physical/labs: two years ago for physical and labs.   Flushing: last episode was around valentines day, when he was at [facility] having steak and had a glass of beer he started to get flushing. No other symptoms.  He has had other events after drinking whiskey where he had flushing from neck down to his chest.  He reports no other symptoms accompanied the flushing.  He has been avoiding whiskey as that is the only form of alcohol that has caused him the symptoms.  He does drink 2 beers daily.  He does vape daily as well.  Immunizations: -Tetanus: unknown of the last one.  -Influenza: declines  Diet: Fair diet. Usually fast food.  Exercise: No regular exercise. Golf once a month, corn hole once a week, physically active work.   Eye exam: Completes annually  Dental exam: Completes annually   Outpatient Encounter Medications as of 06/15/2023  Medication Sig   [DISCONTINUED] acetaminophen (TYLENOL) 500 MG tablet Take 1 tablet (500 mg total) by mouth every 6 (six) hours as needed for mild pain or moderate pain.   [DISCONTINUED] ibuprofen (ADVIL,MOTRIN) 800 MG tablet Take 1 tablet (800 mg total) by mouth 3 (three) times daily.   [DISCONTINUED] mupirocin ointment (BACTROBAN) 2 % Apply 1 application topically 3 (three) times daily.   [DISCONTINUED] traMADol (ULTRAM) 50 MG tablet Take 1 tablet (50 mg total) by mouth every 6 (six) hours as needed  for moderate pain or severe pain.   [DISCONTINUED] triamcinolone cream (KENALOG) 0.1 % Apply 1 application topically 2 (two) times daily.   No facility-administered encounter medications on file as of 06/15/2023.    History reviewed. No pertinent past medical history.  Past Surgical History:  Procedure Laterality Date   HIP SURGERY  2004   WISDOM TOOTH EXTRACTION      History reviewed. No pertinent family history.  Social History   Socioeconomic History   Marital status: Single    Spouse name: Not on file   Number of children: Not on file   Years of education: Not on file   Highest education level: Bachelor's degree (e.g., BA, AB, BS)  Occupational History   Not on file  Tobacco Use   Smoking status: Former   Smokeless tobacco: Former  Substance and Sexual Activity   Alcohol use: Yes    Alcohol/week: 10.0 standard drinks of alcohol    Types: 10 Cans of beer per week   Drug use: No   Sexual activity: Yes    Birth control/protection: None  Other Topics Concern   Not on file  Social History Narrative   Not on file   Social Drivers of Health   Financial Resource Strain: Low Risk  (06/15/2023)   Overall Financial Resource Strain (CARDIA)    Difficulty of Paying Living Expenses: Not very hard  Food Insecurity: No Food  Insecurity (06/15/2023)   Hunger Vital Sign    Worried About Running Out of Food in the Last Year: Never true    Ran Out of Food in the Last Year: Never true  Transportation Needs: No Transportation Needs (06/15/2023)   PRAPARE - Administrator, Civil Service (Medical): No    Lack of Transportation (Non-Medical): No  Physical Activity: Sufficiently Active (06/15/2023)   Exercise Vital Sign    Days of Exercise per Week: 5 days    Minutes of Exercise per Session: 60 min  Stress: Stress Concern Present (06/15/2023)   [employer] of Occupational Health - Occupational Stress Questionnaire    Feeling of Stress : To some extent  Social  Connections: Unknown (06/15/2023)   Social Connection and Isolation Panel [NHANES]    Frequency of Communication with Friends and Family: More than three times a week    Frequency of Social Gatherings with Friends and Family: Three times a week    Attends Religious Services: Patient declined    Active Member of Clubs or Organizations: No    Attends Engineer, structural: Not on file    Marital Status: Married  Catering manager Violence: Not on file    Review of Systems  Constitutional:  Negative for chills, fever, malaise/fatigue and weight loss.  HENT:  Negative for congestion, ear discharge, ear pain, hearing loss, nosebleeds, sinus pain, sore throat and tinnitus.   Eyes:  Negative for blurred vision, double vision, pain, discharge and redness.  Respiratory:  Negative for cough, shortness of breath, wheezing and stridor.   Cardiovascular:  Negative for chest pain, palpitations and leg swelling.  Gastrointestinal:  Negative for abdominal pain, constipation, diarrhea, heartburn, nausea and vomiting.  Genitourinary:  Negative for dysuria, frequency and urgency.  Musculoskeletal:  Negative for myalgias.  Skin:  Negative for rash.  Neurological:  Negative for dizziness, tingling, seizures, weakness and headaches.  Psychiatric/Behavioral:  Negative for depression, substance abuse and suicidal ideas. The patient is not nervous/anxious.        Objective    BP 130/82 (BP Location: Left Arm, Patient Position: Sitting, Cuff Size: Normal)   Pulse 88   Temp 97.9 F (36.6 C) (Oral)   Ht 5\' 11"  (1.803 m)   Wt 186 lb (84.4 kg)   SpO2 98%   BMI 25.94 kg/m   Physical Exam Vitals and nursing note reviewed.  Constitutional:      Appearance: Normal appearance.  HENT:     Head: Normocephalic and atraumatic.     Right Ear: Tympanic membrane, ear canal and external ear normal.     Left Ear: Tympanic membrane, ear canal and external ear normal.     Nose: Nose normal.     Mouth/Throat:      Mouth: Mucous membranes are moist.     Pharynx: Oropharynx is clear.  Eyes:     Conjunctiva/sclera: Conjunctivae normal.     Pupils: Pupils are equal, round, and reactive to light.  Cardiovascular:     Rate and Rhythm: Normal rate and regular rhythm.     Pulses: Normal pulses.     Heart sounds: Normal heart sounds.  Pulmonary:     Effort: Pulmonary effort is normal.     Breath sounds: Normal breath sounds.  Abdominal:     General: Abdomen is flat. Bowel sounds are normal.     Palpations: Abdomen is soft.  Musculoskeletal:        General: Normal range of motion.  Cervical back: Normal range of motion.  Skin:    General: Skin is warm and dry.     Capillary Refill: Capillary refill takes less than 2 seconds.  Neurological:     General: No focal deficit present.     Mental Status: He is alert and oriented to person, place, and time. Mental status is at baseline.  Psychiatric:        Mood and Affect: Mood normal.        Behavior: Behavior normal.        Thought Content: Thought content normal.        Judgment: Judgment normal.         Assessment & Plan:  Establishing care with new doctor, encounter for Assessment & Plan: EMR reviewed briefly.   Need for hepatitis C screening test -     Hepatitis C antibody  Screening for HIV (human immunodeficiency virus) -     HIV Antibody (routine testing w rflx)  Encounter for screening and preventative care Assessment & Plan: Immunizations declines  Discussed the importance of a healthy diet and regular exercise in order for weight loss, and to reduce the risk of further co-morbidity.  Exam stable. Labs pending.  Follow up in 1 year for repeat physical.   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  Suspect this is related to possible allergic reaction to alcohol.  No red flag symptoms  today.  Discussed with patient at length, he will keep a log of his symptoms and bring back if these events occur more frequently.  Consider referral for allergy testing.      Return in about 1 year (around 06/14/2024) for physical.   [place]esto Charon, NP

## 2023-06-15 NOTE — Assessment & Plan Note (Addendum)
 Immunizations declines  Discussed the importance of a healthy diet and regular exercise in order for weight loss, and to reduce the risk of further co-morbidity.  Exam stable. Labs pending.  Follow up in 1 year for repeat physical.

## 2023-06-16 ENCOUNTER — Encounter: Payer: Self-pay | Admitting: General Practice

## 2023-06-16 LAB — COMPREHENSIVE METABOLIC PANEL WITH GFR
ALT: 31 U/L (ref 0–53)
AST: 27 U/L (ref 0–37)
Albumin: 4.4 g/dL (ref 3.5–5.2)
Alkaline Phosphatase: 60 U/L (ref 39–117)
BUN: 11 mg/dL (ref 6–23)
CO2: 28 meq/L (ref 19–32)
Calcium: 9.5 mg/dL (ref 8.4–10.5)
Chloride: 106 meq/L (ref 96–112)
Creatinine, Ser: 0.99 mg/dL (ref 0.40–1.50)
GFR: 95.36 mL/min (ref >60.00)
Glucose, Bld: 112 mg/dL — ABNORMAL HIGH (ref 70–99)
Potassium: 4.2 meq/L (ref 3.5–5.1)
Sodium: 140 meq/L (ref 135–145)
Total Bilirubin: 0.4 mg/dL (ref 0.2–1.2)
Total Protein: 7.8 g/dL (ref 6.0–8.3)

## 2023-06-16 LAB — LIPID PANEL
Cholesterol: 225 mg/dL — ABNORMAL HIGH (ref 0–200)
HDL: 50.2 mg/dL (ref >39.00)
LDL Cholesterol: 152 mg/dL — ABNORMAL HIGH (ref 0–99)
NonHDL: 175.06
Total CHOL/HDL Ratio: 4
Triglycerides: 113 mg/dL (ref 0.0–149.0)
VLDL: 22.6 mg/dL (ref 0.0–40.0)

## 2023-06-16 LAB — HIV ANTIBODY (ROUTINE TESTING W REFLEX): HIV 1&2 Ab, 4th Generation: NONREACTIVE

## 2023-06-16 LAB — CBC
HCT: 43.5 % (ref 39.0–52.0)
Hemoglobin: 14.8 g/dL (ref 13.0–17.0)
MCHC: 34 g/dL (ref 30.0–36.0)
MCV: 95.3 fl (ref 78.0–100.0)
Platelets: 254 10*3/uL (ref 150.0–400.0)
RBC: 4.57 Mil/uL (ref 4.22–5.81)
RDW: 13.9 % (ref 11.5–15.5)
WBC: 7.3 10*3/uL (ref 4.0–10.5)

## 2023-06-16 LAB — HEPATITIS C ANTIBODY: Hepatitis C Ab: NONREACTIVE

## 2024-06-14 ENCOUNTER — Encounter: Admitting: General Practice

## 2024-06-20 ENCOUNTER — Encounter: Admitting: General Practice
